# Patient Record
Sex: Male | Born: 1980 | Race: White | Hispanic: No | Marital: Single | Smoking: Never smoker
Health system: Southern US, Community
[De-identification: ages and names within clinical notes are randomized; demographics above are authoritative.]

---

## 1999-09-23 ENCOUNTER — Encounter: Payer: Self-pay | Admitting: Orthopedic Surgery

## 1999-09-23 ENCOUNTER — Ambulatory Visit (HOSPITAL_COMMUNITY): Admission: RE | Admit: 1999-09-23 | Discharge: 1999-09-23 | Payer: Self-pay | Admitting: Orthopedic Surgery

## 2001-01-14 ENCOUNTER — Emergency Department (HOSPITAL_COMMUNITY): Admission: EM | Admit: 2001-01-14 | Discharge: 2001-01-14 | Payer: Self-pay | Admitting: *Deleted

## 2002-05-27 ENCOUNTER — Inpatient Hospital Stay (HOSPITAL_COMMUNITY): Admission: AC | Admit: 2002-05-27 | Discharge: 2002-05-30 | Payer: Self-pay

## 2006-12-14 ENCOUNTER — Emergency Department (HOSPITAL_COMMUNITY): Admission: EM | Admit: 2006-12-14 | Discharge: 2006-12-14 | Payer: Self-pay | Admitting: Emergency Medicine

## 2016-03-16 ENCOUNTER — Encounter (HOSPITAL_COMMUNITY): Payer: Self-pay

## 2016-03-16 ENCOUNTER — Emergency Department (HOSPITAL_COMMUNITY): Payer: Self-pay

## 2016-03-16 ENCOUNTER — Emergency Department (HOSPITAL_COMMUNITY)
Admission: EM | Admit: 2016-03-16 | Discharge: 2016-03-16 | Disposition: A | Discharge status: Home / Self Care | Payer: Self-pay | Attending: Emergency Medicine | Admitting: Emergency Medicine

## 2016-03-16 DIAGNOSIS — Y939 Activity, unspecified: Secondary | ICD-10-CM | POA: Insufficient documentation

## 2016-03-16 DIAGNOSIS — S52135A Nondisplaced fracture of neck of left radius, initial encounter for closed fracture: Secondary | ICD-10-CM | POA: Insufficient documentation

## 2016-03-16 DIAGNOSIS — Y999 Unspecified external cause status: Secondary | ICD-10-CM | POA: Insufficient documentation

## 2016-03-16 DIAGNOSIS — Y929 Unspecified place or not applicable: Secondary | ICD-10-CM | POA: Insufficient documentation

## 2016-03-16 DIAGNOSIS — S52122A Displaced fracture of head of left radius, initial encounter for closed fracture: Secondary | ICD-10-CM

## 2016-03-16 DIAGNOSIS — W1840XA Slipping, tripping and stumbling without falling, unspecified, initial encounter: Secondary | ICD-10-CM | POA: Insufficient documentation

## 2016-03-16 MED ORDER — HYDROCODONE-ACETAMINOPHEN 5-325 MG PO TABS: 1.0000 | ORAL_TABLET | Freq: Four times a day (QID) | ORAL | Status: DC | PRN

## 2016-03-16 MED ORDER — HYDROCODONE-ACETAMINOPHEN 5-325 MG PO TABS
1.0000 | ORAL_TABLET | Freq: Once | ORAL | Status: AC
  Administered 2016-03-16: 1 via ORAL
  Filled 2016-03-16: qty 1

## 2016-03-16 MED ORDER — NAPROXEN 500 MG PO TABS: 500.0000 mg | ORAL_TABLET | Freq: Two times a day (BID) | ORAL | Status: DC

## 2016-03-16 NOTE — ED Notes (Signed)
Ice pack and pillow in place on pt's left arm.

## 2016-03-16 NOTE — Progress Notes (Signed)
Orthopedic Tech Progress Note Patient Details:  Ronny FlurryGuy M Brayboy 1981-06-02 [id number]  Ortho Devices Type of Ortho Device: Ace wrap, Arm sling, Post (long arm) splint Ortho Device/Splint Location: LUE Ortho Device/Splint Interventions: Ordered, Application   Jennye MoccasinHughes, Nefi Musich Craig 03/16/2016, 7:36 PM

## 2016-03-16 NOTE — Discharge Instructions (Signed)
Call Dr. Ophelia CharterYates office first thing in the morning to schedule a follow up appointment.     Radial Fracture A radial fracture is a break in the radius bone, which is the long bone of the forearm that is on the same side as your thumb. Your forearm is the part of your arm that is between your elbow and your wrist. It is made up of two bones: the radius and the ulna. Most radial fractures occur near the wrist (distal radialfracture) or near the elbow (radial head fracture). A distal radial fracture is the most common type of broken arm. This fracture usually occurs about an inch above the wrist. Fractures of the middle part of the bone are less common. CAUSES  Falling with your arm outstretched is the most common cause of a radial fracture. Other causes include:  Car accidents.  Bike accidents.  A direct blow to the middle part of the radius. RISK FACTORS  You may be at greater risk for a distal radial fracture if you are 36 years of age or older.  You may be at greater risk for a radial head fracture if you are:  Male.  2930-36 years old.  You may be at a greater risk for all types of radial fractures if you have a condition that causes your bones to be weak or thin (osteoporosis). SIGNS AND SYMPTOMS A radial fracture causes pain immediately after the injury. Other signs and symptoms include:  An abnormal bend or bump in your arm (deformity).  Swelling.  Bruising.  Numbness or tingling.  Tenderness.  Limited movement. DIAGNOSIS  Your health care provider may diagnose a radial fracture based on:  Your symptoms.  Your medical history, including any recent injury.  A physical exam. Your health care provider will look for any deformity and feel for tenderness over the break. Your health care provider will also check whether the bone is out of place.  An X-ray exam to confirm the diagnosis and learn more about the type of fracture. TREATMENT The goals of treatment are to  get the bone in proper position for healing and to keep it from moving so it will heal over time. Your treatment will depend on many factors, especially the type of fracture that you have.  If the fractured bone:  Is in the correct position (nondisplaced), you may only need to wear a cast or a splint.  Has a slightly displaced fracture, you may need to have the bones moved back into place manually (closed reduction) before the splint or cast is put on.  You may have a temporary splint before you have a plaster cast. The splint allows room for some swelling. After a few days, a cast can replace the splint.  You may have to wear the cast for about 6 weeks or as directed by your health care provider.  The cast may be changed after about 3 weeks or as directed by your health care provider.  After your cast is taken off, you may need physical therapy to regain full movement in your wrist or elbow.  You may need emergency surgery if you have:  A fractured bone that is out of position (displaced).  A fracture with multiple fragments (comminuted fracture).  A fracture that breaks the skin (open fracture). This type of fracture may require surgical wires, plates, or screws to hold the bone in place.  You may have X-rays every couple of weeks to check on your healing. HOME CARE  INSTRUCTIONS  Keep the injured arm above the level of your heart while you are sitting or lying down. This helps to reduce swelling and pain.  Apply ice to the injured area:  Put ice in a plastic bag.  Place a towel between your skin and the bag.  Leave the ice on for 20 minutes, 2-3 times per day.  Move your fingers often to avoid stiffness and to minimize swelling.  If you have a plaster or fiberglass cast:  Do not try to scratch the skin under the cast using sharp or pointed objects.  Check the skin around the cast every day. You may put lotion on any red or sore areas.  Keep your cast dry and  clean.  If you have a plaster splint:  Wear the splint as directed.  Loosen the elastic around the splint if your fingers become numb and tingle, or if they turn cold and blue.  Do not put pressure on any part of your cast until it is fully hardened. Rest your cast only on a pillow for the first 24 hours.  Protect your cast or splint while bathing or showering, as directed by your health care provider. Do not put your cast or splint into water.  Take medicines only as directed by your health care provider.  Return to activities, such as sports, as directed by your health care provider. Ask your health care provider what activities are safe for you.  Keep all follow-up visits as directed by your health care provider. This is important. SEEK MEDICAL CARE IF:  Your pain medicine is not helping.  Your cast gets damaged or it breaks.  Your cast becomes loose.  Your cast gets wet.  You have more severe pain or swelling than you did before the cast.  You have severe pain when stretching your fingers.  You continue to have pain or stiffness in your elbow or your wrist after your cast is taken off. SEEK IMMEDIATE MEDICAL CARE IF:  You cannot move your fingers.  You lose feeling in your fingers or your hand.  Your hand or your fingers turn cold and pale or blue.  You notice a bad smell coming from your cast.  You have drainage from underneath your cast.  You have new stains from blood or drainage seeping through your cast.   This information is not intended to replace advice given to you by your health care provider. Make sure you discuss any questions you have with your health care provider.   Document Released: 03/03/2006 Document Revised: 10/11/2014 Document Reviewed: 03/15/2014 Elsevier Interactive Patient Education [employer]
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
## 2016-03-16 NOTE — ED Notes (Signed)
Patient tripped over baby gate 2 days ago and now having ongoing left arm pain. Pain from elbow to hand
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
## 2016-03-16 NOTE — ED Notes (Signed)
Ortho paged and on the way 

## 2018-03-21 ENCOUNTER — Other Ambulatory Visit: Payer: Self-pay

## 2018-03-21 ENCOUNTER — Ambulatory Visit (INDEPENDENT_AMBULATORY_CARE_PROVIDER_SITE_OTHER): Payer: Self-pay

## 2018-03-21 ENCOUNTER — Encounter (HOSPITAL_COMMUNITY): Payer: Self-pay | Admitting: Emergency Medicine

## 2018-03-21 ENCOUNTER — Ambulatory Visit (HOSPITAL_COMMUNITY)
Admission: EM | Admit: 2018-03-21 | Discharge: 2018-03-21 | Disposition: A | Discharge status: Home / Self Care | Payer: Self-pay | Attending: Internal Medicine | Admitting: Internal Medicine

## 2018-03-21 DIAGNOSIS — M545 Low back pain: Secondary | ICD-10-CM

## 2018-03-21 DIAGNOSIS — M542 Cervicalgia: Secondary | ICD-10-CM

## 2018-03-21 MED ORDER — KETOROLAC TROMETHAMINE 60 MG/2ML IM SOLN
INTRAMUSCULAR | Status: AC
  Filled 2018-03-21: qty 2

## 2018-03-21 MED ORDER — METHYLPREDNISOLONE SODIUM SUCC 125 MG IJ SOLR
125.0000 mg | Freq: Once | INTRAMUSCULAR | Status: AC
  Administered 2018-03-21: 125 mg via INTRAMUSCULAR

## 2018-03-21 MED ORDER — METHYLPREDNISOLONE SODIUM SUCC 125 MG IJ SOLR
INTRAMUSCULAR | Status: AC
  Filled 2018-03-21: qty 2

## 2018-03-21 MED ORDER — KETOROLAC TROMETHAMINE 60 MG/2ML IM SOLN
60.0000 mg | Freq: Once | INTRAMUSCULAR | Status: AC
  Administered 2018-03-21: 60 mg via INTRAMUSCULAR

## 2018-03-21 MED ORDER — CYCLOBENZAPRINE HCL 5 MG PO TABS: 5.0000 mg | ORAL_TABLET | Freq: Every day | ORAL | 0 refills | Status: DC

## 2018-03-21 MED ORDER — MELOXICAM 7.5 MG PO TABS: 7.5000 mg | ORAL_TABLET | Freq: Every day | ORAL | 0 refills | Status: DC
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
## 2021-10-22 ENCOUNTER — Emergency Department (HOSPITAL_COMMUNITY): Payer: Self-pay

## 2021-10-22 ENCOUNTER — Other Ambulatory Visit: Payer: Self-pay

## 2021-10-22 ENCOUNTER — Emergency Department (HOSPITAL_COMMUNITY)
Admission: EM | Admit: 2021-10-22 | Discharge: 2021-10-22 | Disposition: A | Discharge status: Home / Self Care | Payer: Self-pay | Attending: Emergency Medicine | Admitting: Emergency Medicine

## 2021-10-22 DIAGNOSIS — W182XXA Fall in (into) shower or empty bathtub, initial encounter: Secondary | ICD-10-CM | POA: Insufficient documentation

## 2021-10-22 DIAGNOSIS — S2232XA Fracture of one rib, left side, initial encounter for closed fracture: Secondary | ICD-10-CM | POA: Insufficient documentation

## 2021-10-22 MED ORDER — HYDROCODONE-ACETAMINOPHEN 5-325 MG PO TABS: 1.0000 | ORAL_TABLET | Freq: Three times a day (TID) | ORAL | 0 refills | Status: AC | PRN

## 2021-10-22 MED ORDER — LIDOCAINE 4 % EX PTCH: 1.0000 | MEDICATED_PATCH | Freq: Two times a day (BID) | CUTANEOUS | 0 refills | Status: AC

## 2021-10-22 MED ORDER — METHOCARBAMOL 500 MG PO TABS: 500.0000 mg | ORAL_TABLET | Freq: Two times a day (BID) | ORAL | 0 refills | Status: AC

## 2021-10-22 NOTE — ED Triage Notes (Signed)
Pt states having fallen while stepping out of the shower last week on Wednesday, did not seek medical attention at that time. He states he slipped and fell landing on his left side. C/o ongoing left side rib pain and SOB since fall. No other pain. Ambulatory.

## 2021-10-22 NOTE — ED Provider Notes (Signed)
[facility] EMERGENCY DEPARTMENT Provider Note   CSN: [id number] Arrival date & time: 10/22/21  1845     History  Chief Complaint  Patient presents with   Craig Walters is a 41 y.o. male.  HPI    41 year old male comes in with chief complaint of follow-up Patient indicates that he fell about a week ago while getting out of the shower.  He did not seek attention at that time.  Over time his pain is persisted, he is having difficulty lifting and decided to come to the ER.  Patient works at [employer] and has not gone to work because of his pain.  Pain is left-sided, posteriorly.  He has shortness of breath only with exertion and with any kind of movement or lifting.  He has taken Tylenol for pain control.  Home Medications Prior to Admission medications   Medication Sig Start Date End Date Taking? Authorizing Provider  HYDROcodone-acetaminophen (NORCO/VICODIN) 5-325 MG tablet Take 1 tablet by mouth every 8 (eight) hours as needed for up to 3 days for severe pain. 10/22/21 10/25/21 Yes Jaterrius Ricketson, MD  Lidocaine 4 % PTCH Apply 1 patch topically 2 (two) times daily. 10/22/21  Yes Derwood Kaplan, MD  methocarbamol (ROBAXIN) 500 MG tablet Take 1 tablet (500 mg total) by mouth 2 (two) times daily. 10/22/21  Yes Derwood Kaplan, MD      Allergies    Patient has no known allergies.    Review of Systems   Review of Systems  Constitutional:  Positive for activity change.  Cardiovascular:  Positive for chest pain.   Physical Exam Updated Vital Signs BP (!) 140/91    Pulse (!) 109    Temp 98.7 F (37.1 C) (Oral)    Resp 19    Ht 6\' 4"  (1.93 m)    Wt 93 kg    SpO2 95%    BMI 24.95 kg/m  Physical Exam Vitals and nursing note reviewed.  Constitutional:      Appearance: He is well-developed.  HENT:     Head: Atraumatic.  Cardiovascular:     Rate and Rhythm: Normal rate.  Pulmonary:     Effort: Pulmonary effort is normal.  Musculoskeletal:     Cervical  back: Neck supple.  Skin:    General: Skin is warm.  Neurological:     Mental Status: He is alert and oriented to person, place, and time.    ED Results / Procedures / Treatments   Labs (all labs ordered are listed, but only abnormal results are displayed) Labs Reviewed - No data to display  EKG None  Radiology DG Ribs Unilateral W/Chest Left  Result Date: 10/22/2021 CLINICAL DATA:  SOB, FALL EXAM: LEFT RIBS AND CHEST - 3+ VIEW COMPARISON:  None. FINDINGS: The heart and mediastinal contours are within normal limits. Biapical pleural/pulmonary scarring. No focal consolidation. No pulmonary edema. No pleural effusion. No pneumothorax. Punctate metallic BB marker noted overlying the lower left chest wall. Acute minimally displaced eleventh left rib fracture. IMPRESSION: 1. Acute minimally displaced eleventh left rib fracture. 2. No acute cardiopulmonary abnormality. Electronically Signed   By: 10/24/2021 M.D.   On: 10/22/2021 19:50    Procedures Procedures    Medications Ordered in ED Medications - No data to display  ED Course/ Medical Decision Making/ A&P  Medical Decision Making 41 year old male comes in with chief complaint of fall.  Fall occurred a week ago.  He is having left-sided posterior rib pain.  Pain with inspiration.  Pain with any kind of movement and lifting.  On exam, patient does has no respiratory distress.  No crepitus.  X-rays reviewed with the patient.  He is noted to have likely a left-sided rib fracture.  I have reviewed the x-ray, there is no evidence of pneumothorax.  Patient injury is now more than a week out.  We will try to control his symptoms better.  It does not appear that he has had any complication from this injury.  Advised that he continues to take deep breaths as often as possible to prevent infection, and to return to the ER if he starts having worsening symptoms or fevers, chills.  Amount and/or Complexity of Data  Reviewed Radiology: ordered and independent interpretation performed.  Risk OTC drugs. Prescription drug management.           Final Clinical Impression(s) / ED Diagnoses Final diagnoses:  Closed fracture of one rib of left side, initial encounter    Rx / DC Orders ED Discharge Orders          Ordered    methocarbamol (ROBAXIN) 500 MG tablet  2 times daily        10/22/21 2219    HYDROcodone-acetaminophen (NORCO/VICODIN) 5-325 MG tablet  Every 8 hours PRN        10/22/21 2219    Lidocaine 4 % PTCH  2 times daily        10/22/21 2219              Derwood Kaplan, MD 10/23/21 0015

## 2021-10-22 NOTE — Discharge Instructions (Addendum)
You were seen in the ER for fall. The x-ray shows that you have a singular rib fracture.  We recommend that you take the medications prescribed.  Ice the area of discomfort.  At this stage, he can also apply heat to further alleviate the discomfort.  Please follow-up with general surgery if your symptoms will get better.

## 2021-10-22 NOTE — ED Provider Triage Note (Signed)
Emergency Medicine Provider Triage Evaluation Note  STANELY CHAPEL , a 41 y.o. male  was evaluated in triage.  Pt complains of left rib pain.  Worse when he breathes, happened after falling in the shower a week ago.  Did not hit head, no vomiting or emesis. No prodromal symptoms    Review of Systems  Positive: RIB PAIN Negative: SYNCOPE  Physical Exam  BP (!) 140/91    Pulse (!) 109    Temp 98.7 F (37.1 C) (Oral)    Resp 19    Ht 6\' 4"  (1.93 m)    Wt 93 kg    SpO2 95%    BMI 24.95 kg/m  Gen:   Awake, no distress   Resp:  Normal effort  MSK:   Moves extremities without difficulty  Other:  Tenderness with palpation to the left lower rib cage.  Lung sounds present bilat  Medical Decision Making  Medically screening exam initiated at 7:25 PM.  Appropriate orders placed.  Romilda Garret was informed that the remainder of the evaluation will be completed by another provider, this initial triage assessment does not replace that evaluation, and the importance of remaining in the ED until their evaluation is complete.  xray   Sherrill Raring, PA-C 10/22/21 1926

## 2022-06-16 IMAGING — CR DG RIBS W/ CHEST 3+V*L*
5 series · 5 of 5 positions shown · non-contrast
Comparison: None.

CLINICAL DATA: SOB, FALL

EXAM:
LEFT RIBS AND CHEST - 3+ VIEW

[chest pa]
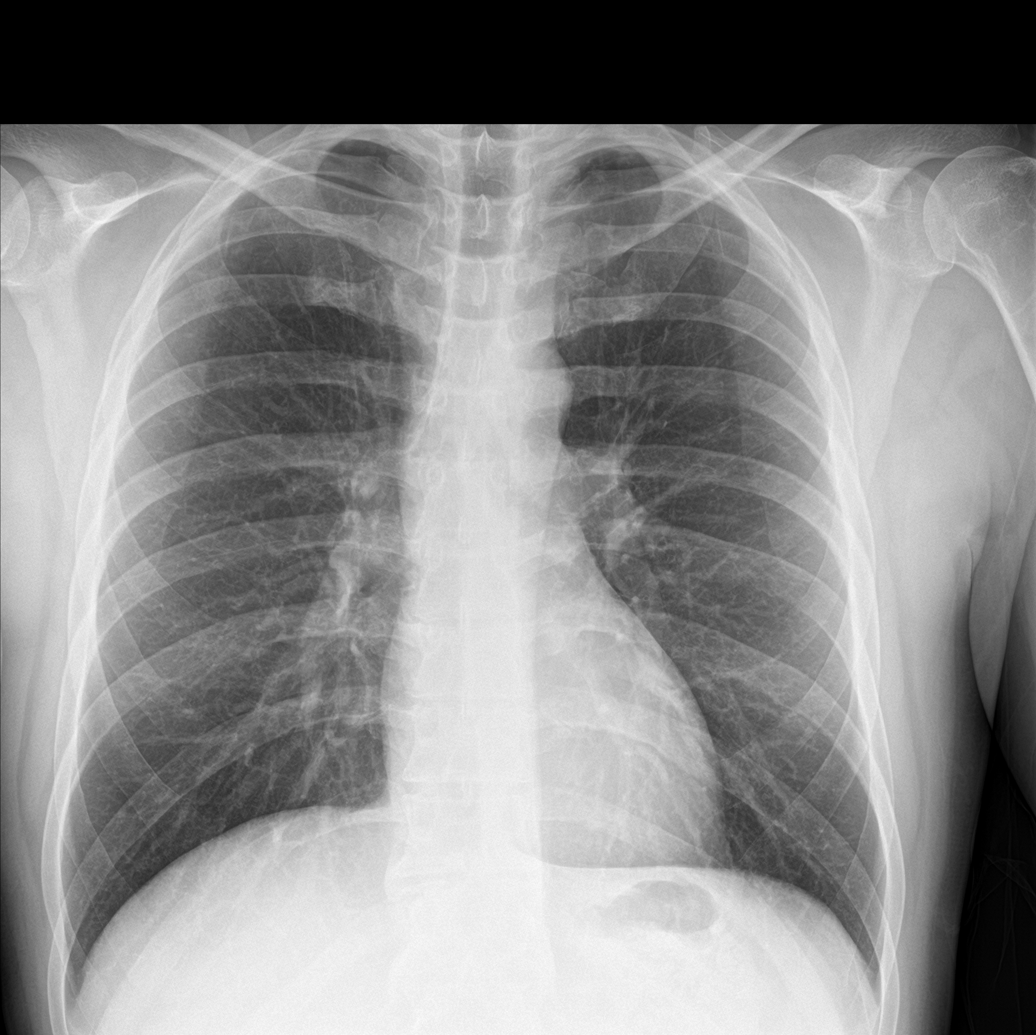

[rib ap (1 of 2)]
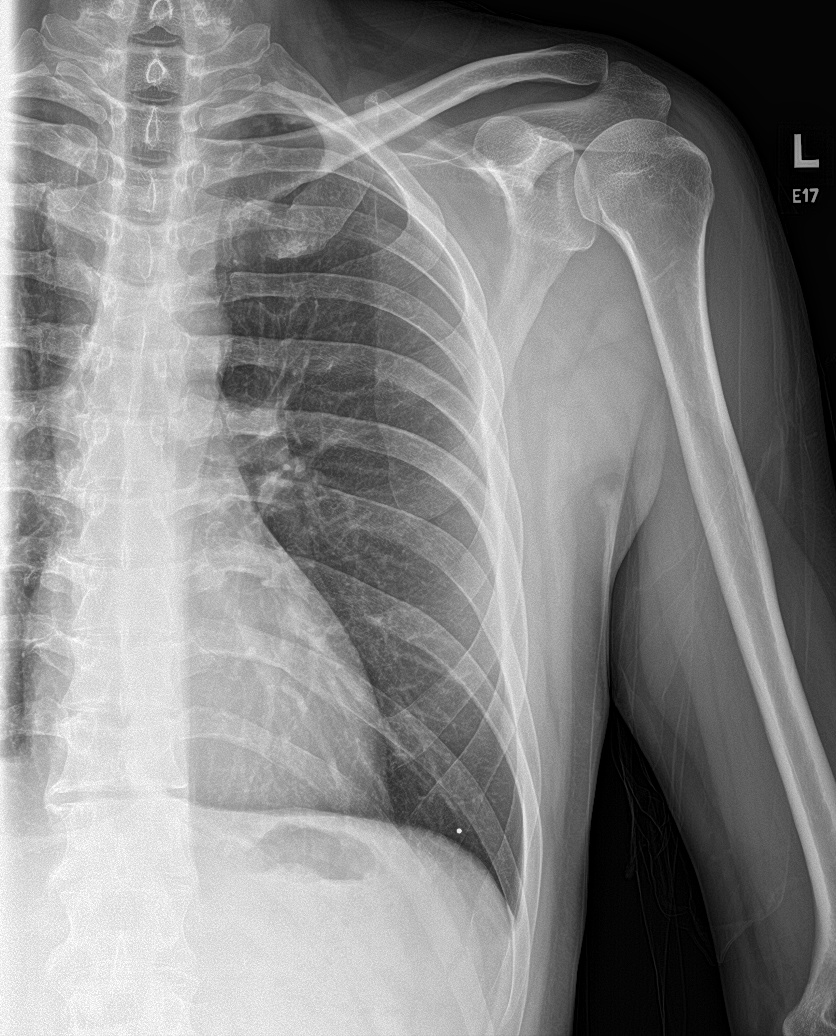

[rib ap (2 of 2)]
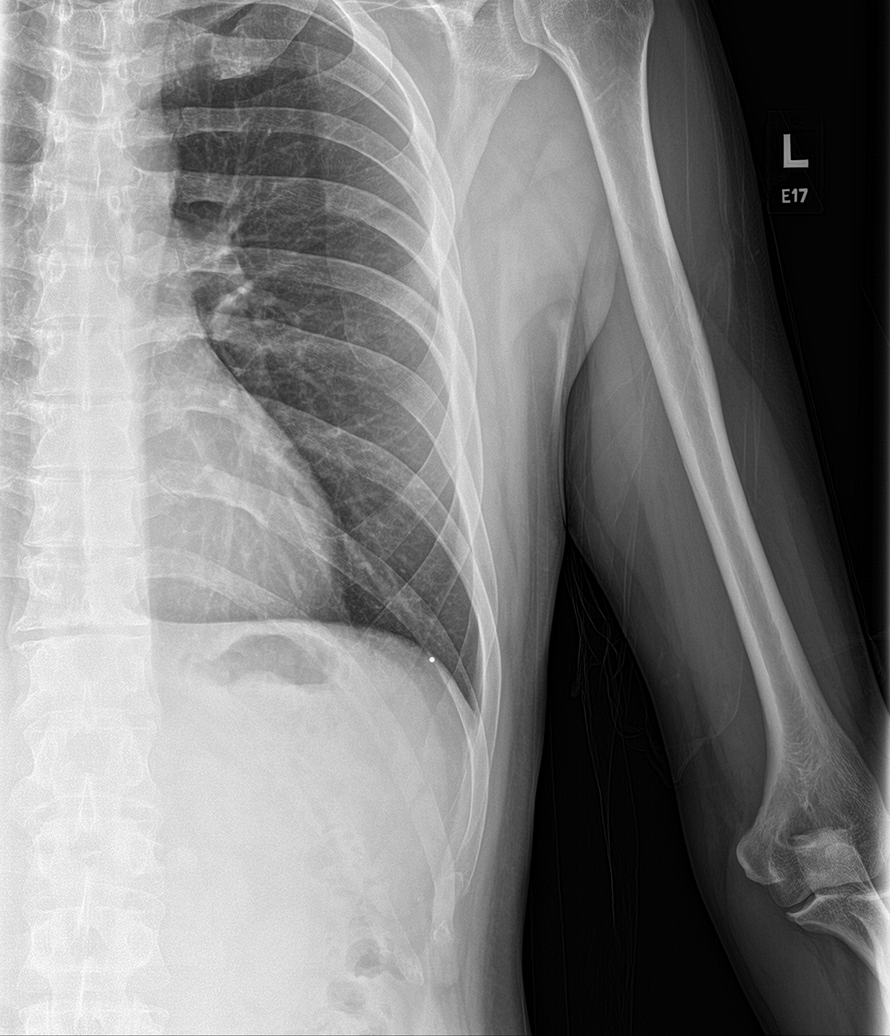

[rib ap obl (1 of 2)]
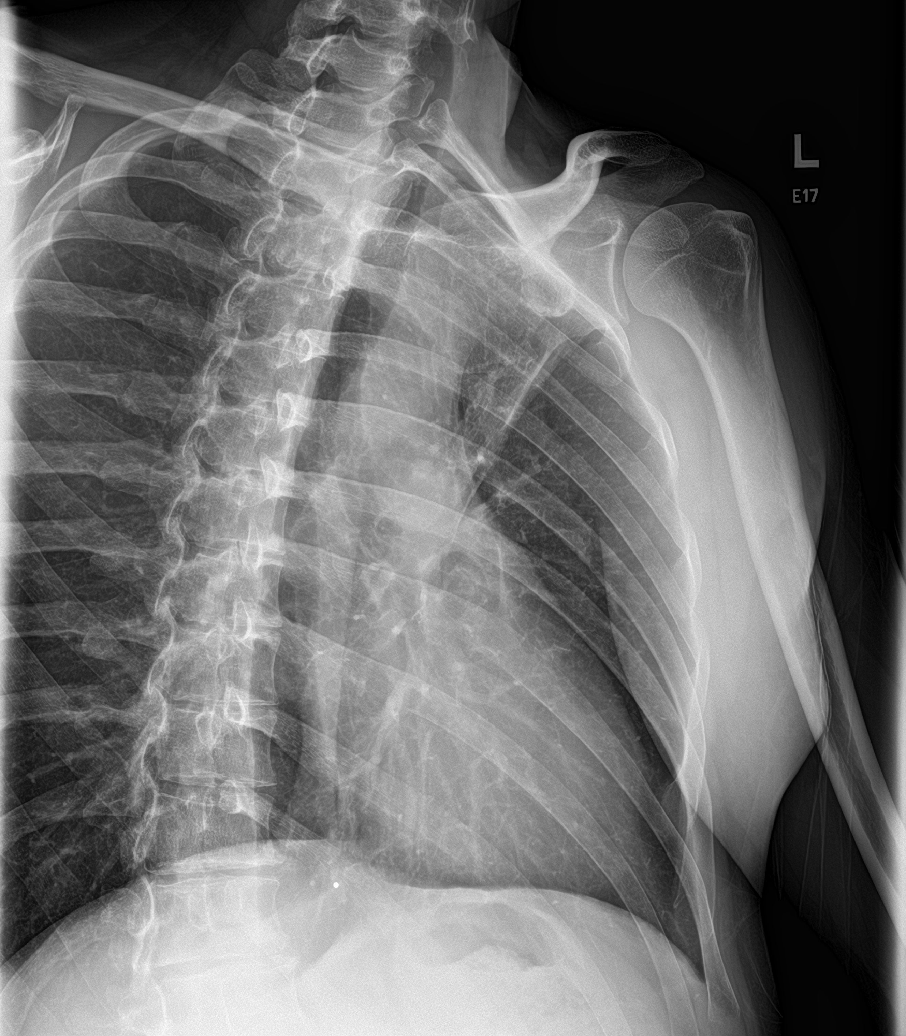

[rib ap obl (2 of 2)]
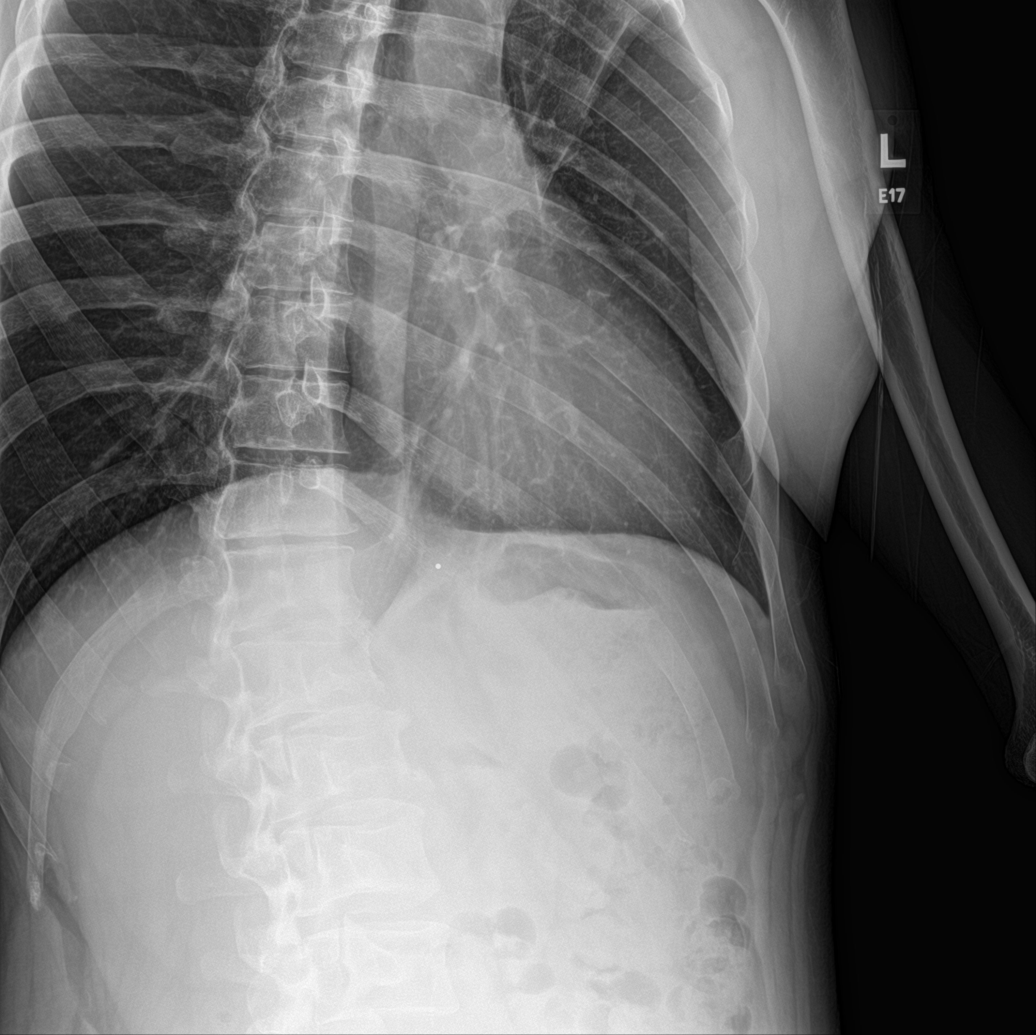

[5 of 5 positions shown; findings below may reference images not displayed]

FINDINGS: The heart and mediastinal contours are within normal limits.

Biapical pleural/pulmonary scarring. No focal consolidation. No
pulmonary edema. No pleural effusion. No pneumothorax.

Punctate metallic BB marker noted overlying the lower left chest
wall. Acute minimally displaced eleventh left rib fracture.
IMPRESSION: 1. Acute minimally displaced eleventh left rib fracture.
2. No acute cardiopulmonary abnormality.
